# Patient Record
Sex: Male | Born: 1975 | Hispanic: Yes | Marital: Married | Smoking: Never smoker
Health system: Southern US, Community
[De-identification: ages and names within clinical notes are randomized; demographics above are authoritative.]

## PROBLEM LIST (undated history)

## (undated) DIAGNOSIS — E7800 Pure hypercholesterolemia, unspecified: Secondary | ICD-10-CM

## (undated) DIAGNOSIS — B019 Varicella without complication: Secondary | ICD-10-CM

## (undated) HISTORY — DX: Varicella without complication: B01.9

## (undated) HISTORY — PX: NO PAST SURGERIES: SHX2092

---

## 2008-04-07 ENCOUNTER — Emergency Department: Payer: Self-pay | Admitting: Emergency Medicine

## 2010-12-08 ENCOUNTER — Other Ambulatory Visit: Payer: Self-pay | Admitting: Orthopedic Surgery

## 2010-12-08 DIAGNOSIS — M5126 Other intervertebral disc displacement, lumbar region: Secondary | ICD-10-CM

## 2010-12-10 ENCOUNTER — Ambulatory Visit
Admission: RE | Admit: 2010-12-10 | Discharge: 2010-12-10 | Disposition: A | Discharge status: Home / Self Care | Payer: Worker's Compensation | Source: Ambulatory Visit | Attending: Orthopedic Surgery | Admitting: Orthopedic Surgery

## 2010-12-10 DIAGNOSIS — M5126 Other intervertebral disc displacement, lumbar region: Secondary | ICD-10-CM

## 2011-03-03 ENCOUNTER — Other Ambulatory Visit: Payer: Self-pay | Admitting: Neurosurgery

## 2011-03-03 DIAGNOSIS — M545 Low back pain: Secondary | ICD-10-CM

## 2011-03-04 ENCOUNTER — Ambulatory Visit
Admission: RE | Admit: 2011-03-04 | Discharge: 2011-03-04 | Disposition: A | Discharge status: Home / Self Care | Payer: Worker's Compensation | Source: Ambulatory Visit | Attending: Neurosurgery | Admitting: Neurosurgery

## 2011-03-04 DIAGNOSIS — M545 Low back pain: Secondary | ICD-10-CM

## 2011-03-19 ENCOUNTER — Other Ambulatory Visit: Payer: Self-pay | Admitting: Neurosurgery

## 2011-03-19 DIAGNOSIS — R51 Headache: Secondary | ICD-10-CM

## 2011-03-19 DIAGNOSIS — R110 Nausea: Secondary | ICD-10-CM

## 2011-03-22 ENCOUNTER — Inpatient Hospital Stay: Admission: RE | Admit: 2011-03-22 | Payer: Worker's Compensation | Source: Ambulatory Visit

## 2011-03-24 ENCOUNTER — Ambulatory Visit
Admission: RE | Admit: 2011-03-24 | Discharge: 2011-03-24 | Disposition: A | Discharge status: Home / Self Care | Payer: Worker's Compensation | Source: Ambulatory Visit | Attending: Neurosurgery | Admitting: Neurosurgery

## 2011-03-24 DIAGNOSIS — R51 Headache: Secondary | ICD-10-CM

## 2011-03-24 DIAGNOSIS — R110 Nausea: Secondary | ICD-10-CM

## 2012-09-25 IMAGING — CT CT L SPINE W/ CM
3 of 9 series · 10 of 27 positions shown, 11 images · non-contrast
Comparison: MRI is not available

CLINICAL DATA: Low back pain

CT MYELOGRAPHY LUMBAR SPINE
TECHNIQUE: CT imaging of the lumbar spine was performed after
intrathecal contrast administration.  Multiplanar CT image
reconstructions were also generated.

[Series 2: l spine bone · axial · 0.27mm/px · z∈[+33,+108]mm · 2 of 92 slices shown, 3 images]
[im 31/92  soft-tissue]
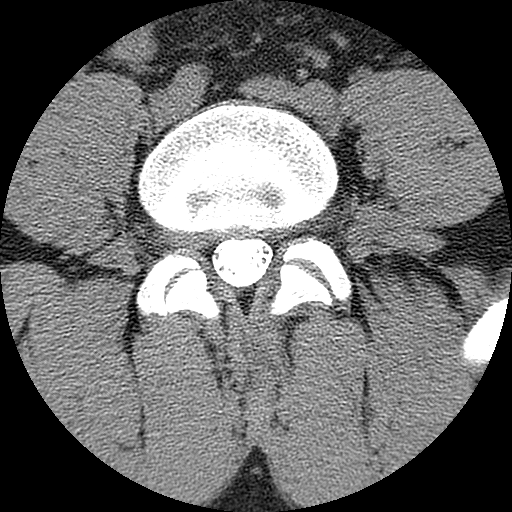
[im 31/92  bone]
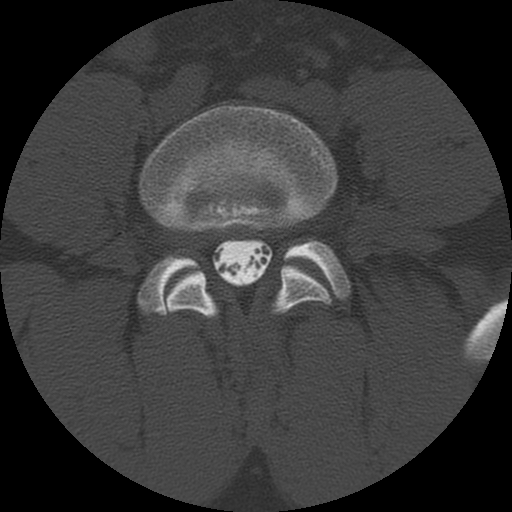
[im 61/92  bone]
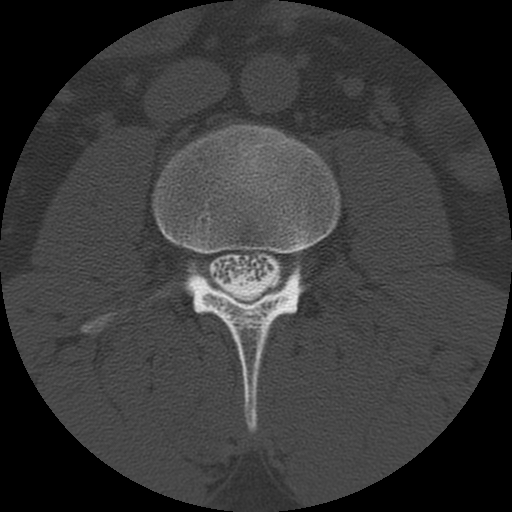

[Series 3: l spine soft · axial · 0.27mm/px · z∈[+13,+128]mm · 3 of 92 slices shown]
[im 23/92  soft-tissue]
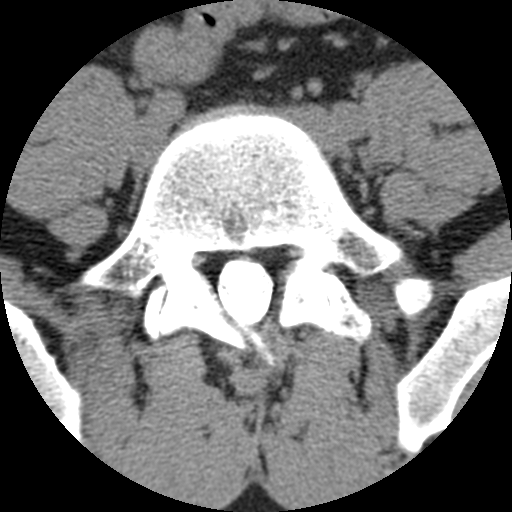
[im 46/92  soft-tissue]
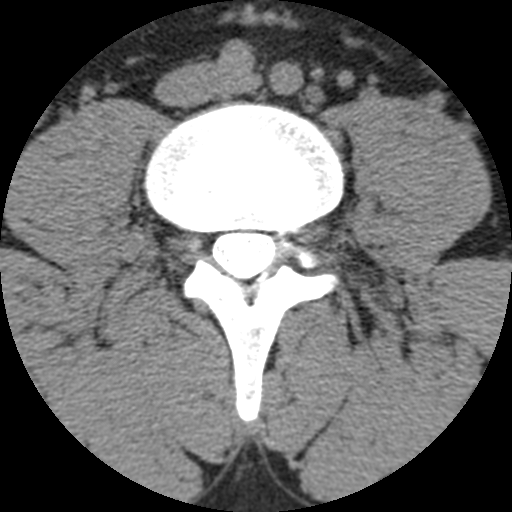
[im 69/92  soft-tissue]
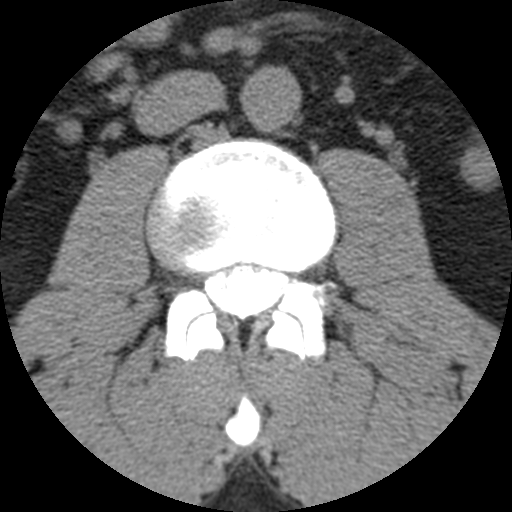

[Series 400: coronal · coronal · 0.46mm/px · 5 of 50 slices shown]
[im 9/50  bone]
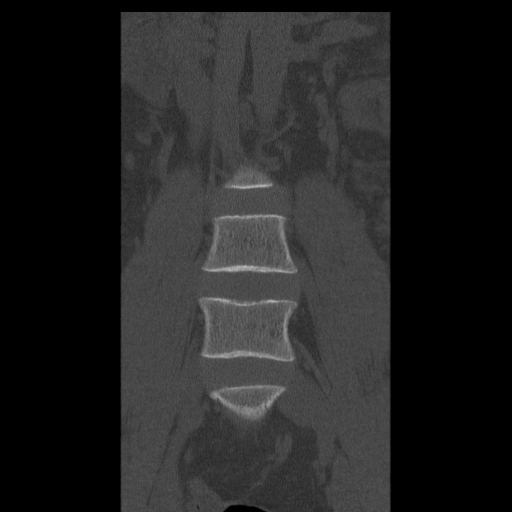
[im 17/50  bone]
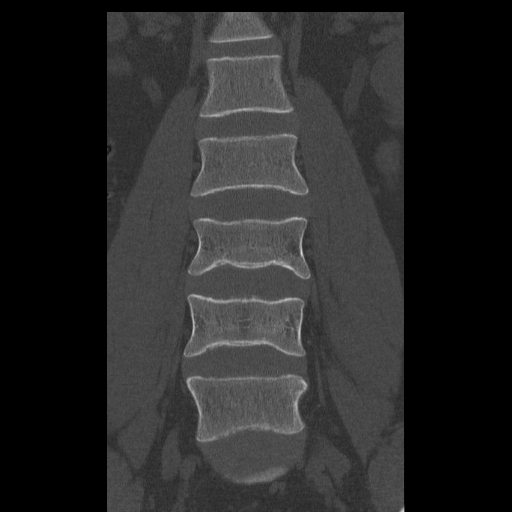
[im 25/50  bone]
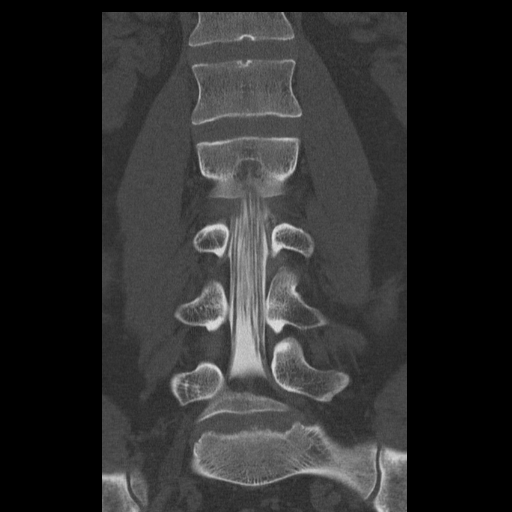
[im 33/50  bone]
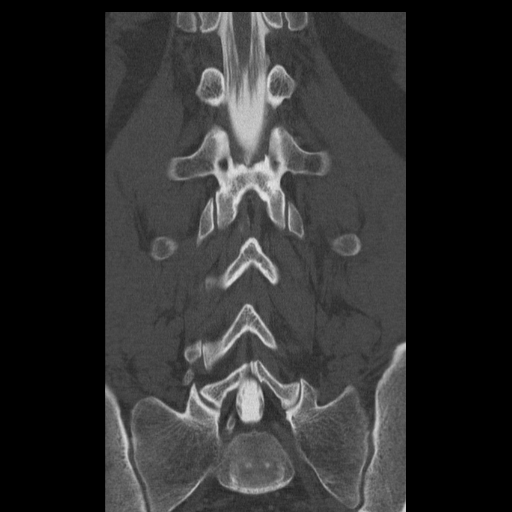
[im 41/50  bone]
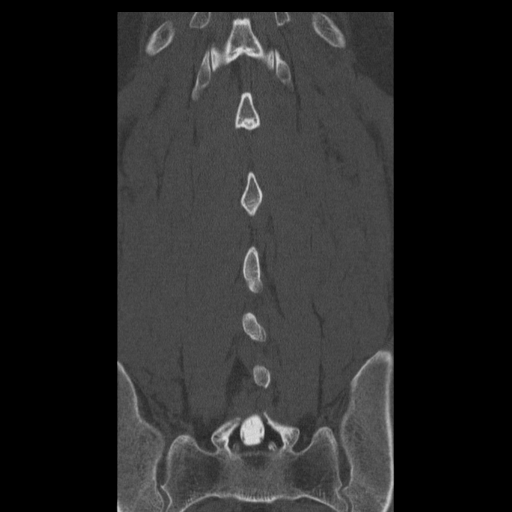

[10 of 27 positions shown; findings below may reference images not displayed]

FINDINGS: Anatomic alignment.  No vertebral compression deformity.
No pars defect. Failure fusion of the posterior elements at L5.
Conus medullaris terminates at the L1 inferior endplate.
Visualized aorta and kidneys are unremarkable.

L1-2:  Unremarkable.

L2-3:  Unremarkable.

L3-4:  Unremarkable.

L4-5:  Right foraminal protrusion with some right foraminal
narrowing and L4 nerve root encroachment.  No central stenosis.
Lateral recesses patent.

L5-S1:  Minimal left paracentral protrusion but no evidence of S1
nerve encroachment. There is some crowding of the left foramen
secondary to facet arthropathy and degenerative disc disease.
IMPRESSION: Right foraminal protrusion at L4-5. Minimal right L4 nerve root
encroachment.

Left paracentral protrusion at L5 S1. Left S1 nerve root is
unaffected by this. Mild left foraminal narrowing.

## 2016-05-13 ENCOUNTER — Other Ambulatory Visit: Payer: Self-pay | Admitting: Internal Medicine

## 2016-05-13 ENCOUNTER — Ambulatory Visit
Admission: RE | Admit: 2016-05-13 | Discharge: 2016-05-13 | Disposition: A | Discharge status: Home / Self Care | Payer: Self-pay | Source: Ambulatory Visit | Attending: Internal Medicine | Admitting: Internal Medicine

## 2016-05-13 DIAGNOSIS — I83891 Varicose veins of right lower extremities with other complications: Secondary | ICD-10-CM | POA: Insufficient documentation

## 2016-05-13 DIAGNOSIS — R51 Headache: Secondary | ICD-10-CM | POA: Insufficient documentation

## 2016-05-13 DIAGNOSIS — Z7689 Persons encountering health services in other specified circumstances: Secondary | ICD-10-CM | POA: Insufficient documentation

## 2016-09-23 DIAGNOSIS — M5416 Radiculopathy, lumbar region: Secondary | ICD-10-CM | POA: Insufficient documentation

## 2016-09-23 DIAGNOSIS — I83891 Varicose veins of right lower extremities with other complications: Secondary | ICD-10-CM | POA: Insufficient documentation

## 2016-09-24 ENCOUNTER — Other Ambulatory Visit: Payer: Self-pay | Admitting: Internal Medicine

## 2016-09-24 DIAGNOSIS — G44229 Chronic tension-type headache, not intractable: Secondary | ICD-10-CM

## 2016-10-06 ENCOUNTER — Ambulatory Visit
Admission: RE | Admit: 2016-10-06 | Discharge: 2016-10-06 | Disposition: A | Discharge status: Home / Self Care | Payer: 59 | Source: Ambulatory Visit | Attending: Internal Medicine | Admitting: Internal Medicine

## 2016-10-06 DIAGNOSIS — G44229 Chronic tension-type headache, not intractable: Secondary | ICD-10-CM | POA: Diagnosis present

## 2016-10-06 DIAGNOSIS — J320 Chronic maxillary sinusitis: Secondary | ICD-10-CM | POA: Insufficient documentation

## 2016-10-06 MED ORDER — GADOBENATE DIMEGLUMINE 529 MG/ML IV SOLN
20.0000 mL | Freq: Once | INTRAVENOUS | Status: AC | PRN
  Administered 2016-10-06: 16 mL via INTRAVENOUS

## 2019-01-24 DIAGNOSIS — R519 Headache, unspecified: Secondary | ICD-10-CM | POA: Insufficient documentation

## 2019-04-10 ENCOUNTER — Telehealth: Payer: Self-pay

## 2019-04-10 NOTE — Telephone Encounter (Signed)
Received called from patient stating his son received positive test results for COVID-19 on 04/09/19. Patient stated [place] [facility] advised him to self quarantine d/t being in close contact with son for 14 days. Patient stated he had COVID 33 tested performed today at [facility]. Patient advised his return to work date would be 04/24/19. Patient otherwise exhibiting no signs/symptoms of COVID-19.

## 2019-04-12 ENCOUNTER — Telehealth: Payer: Self-pay

## 2019-04-12 NOTE — Telephone Encounter (Signed)
Called patient to follow up. Patient states he continues to be asymptomatic at this time.

## 2019-04-19 ENCOUNTER — Other Ambulatory Visit (INDEPENDENT_AMBULATORY_CARE_PROVIDER_SITE_OTHER): Payer: Self-pay | Admitting: Vascular Surgery

## 2019-04-19 DIAGNOSIS — M7989 Other specified soft tissue disorders: Secondary | ICD-10-CM

## 2019-04-23 ENCOUNTER — Other Ambulatory Visit: Payer: Self-pay

## 2019-04-23 ENCOUNTER — Encounter (INDEPENDENT_AMBULATORY_CARE_PROVIDER_SITE_OTHER): Payer: Self-pay

## 2019-04-23 ENCOUNTER — Encounter (INDEPENDENT_AMBULATORY_CARE_PROVIDER_SITE_OTHER): Payer: Self-pay | Admitting: Vascular Surgery

## 2019-04-23 ENCOUNTER — Ambulatory Visit (INDEPENDENT_AMBULATORY_CARE_PROVIDER_SITE_OTHER): Payer: BC Managed Care – PPO | Admitting: Vascular Surgery

## 2019-04-23 DIAGNOSIS — I872 Venous insufficiency (chronic) (peripheral): Secondary | ICD-10-CM | POA: Diagnosis not present

## 2019-04-23 DIAGNOSIS — I8311 Varicose veins of right lower extremity with inflammation: Secondary | ICD-10-CM

## 2019-04-23 DIAGNOSIS — I8312 Varicose veins of left lower extremity with inflammation: Secondary | ICD-10-CM | POA: Diagnosis not present

## 2019-04-23 NOTE — Progress Notes (Signed)
MRN : [id number]  Willie Lam is a 43 y.o. (1976/07/24) male who presents with chief complaint of  Chief Complaint  Patient presents with  . New Patient (Initial Visit)    ref HandeLLE swelling  .  History of Present Illness:   The patient is seen for evaluation of symptomatic varicose veins of the right leg. The patient relates burning and stinging which worsened steadily throughout the course of the day, particularly with standing. The patient also notes an aching and throbbing pain over the varicosities, particularly with prolonged dependent positions. The symptoms are significantly improved with elevation.  The patient also notes that during hot weather the symptoms are greatly intensified. The patient states the pain from the varicose veins interferes with work, daily exercise, shopping and household maintenance. At this point, the symptoms are persistent and severe enough that they're having a negative impact on lifestyle and are interfering with daily activities.  There is no history of DVT, PE or superficial thrombophlebitis. There is no history of ulceration or hemorrhage. The patient denies a significant family history of varicose veins.  The patient has not worn graduated compression in the past. At the present time the patient has not been using over-the-counter analgesics. There is no history of prior surgical intervention or sclerotherapy.    Current Meds  Medication Sig  . atorvastatin (LIPITOR) 10 MG tablet Take 10 mg by mouth daily.  Marland Kitchen ibuprofen (ADVIL) 200 MG tablet Take by mouth.  . Multiple Vitamin (MULTI-VITAMIN) tablet Take by mouth.  . tamsulosin (FLOMAX) 0.4 MG CAPS capsule TAKE 1 CAPSULE (0.4 MG TOTAL) BY MOUTH ONCE DAILY TAKE 30 MINUTES AFTER SAME MEAL EACH DAY.  Marland Kitchen tiZANidine (ZANAFLEX) 4 MG tablet TAKE 1 TABLET (4 MG TOTAL) BY MOUTH 3 (THREE) TIMES DAILY AS NEEDED  . topiramate (TOPAMAX) 25 MG tablet Take by mouth.    Past Medical History:   Diagnosis Date  . Chicken pox     Past Surgical History:  Procedure Laterality Date  . NO PAST SURGERIES      Social History Social History   Tobacco Use  . Smoking status: Never Smoker  . Smokeless tobacco: Never Used  Substance Use Topics  . Alcohol use: Never    Frequency: Never  . Drug use: Never    Family History Family History  Problem Relation Age of Onset  . Varicose Veins Neg Hx   . Vision loss Neg Hx   . Stroke Neg Hx   No family history of bleeding/clotting disorders, porphyria or autoimmune disease   No Known Allergies   REVIEW OF SYSTEMS (Negative unless checked)  Constitutional: [] Weight loss  [] Fever  [] Chills Cardiac: [] Chest pain   [] Chest pressure   [] Palpitations   [] Shortness of breath when laying flat   [] Shortness of breath with exertion. Vascular:  [] Pain in legs with walking   [x] Pain in legs at rest  [] History of DVT   [] Phlebitis   [x] Swelling in legs   [x] Varicose veins   [] Non-healing ulcers Pulmonary:   [] Uses home oxygen   [] Productive cough   [] Hemoptysis   [] Wheeze  [] COPD   [] Asthma Neurologic:  [] Dizziness   [] Seizures   [] History of stroke   [] History of TIA  [] Aphasia   [] Vissual changes   [] Weakness or numbness in arm   [] Weakness or numbness in leg Musculoskeletal:   [] Joint swelling   [] Joint pain   [] Low back pain Hematologic:  [] Easy bruising  [] Easy bleeding   [] Hypercoagulable state   []   Anemic Gastrointestinal:  [] Diarrhea   [] Vomiting  [] Gastroesophageal reflux/heartburn   [] Difficulty swallowing. Genitourinary:  [] Chronic kidney disease   [] Difficult urination  [] Frequent urination   [] Blood in urine Skin:  [] Rashes   [] Ulcers  Psychological:  [] History of anxiety   []  History of major depression.  Physical Examination  Vitals:   04/23/19 1518  BP: 125/79  Pulse: 85  Resp: 16  Weight: 180 lb (81.6 kg)   There is no height or weight on file to calculate BMI. Gen: WD/WN, NAD Head: [place]/AT, No temporalis wasting.   Ear/Nose/Throat: Hearing grossly intact, nares w/o erythema or drainage, poor dentition Eyes: PER, EOMI, sclera nonicteric.  Neck: Supple, no masses.  No bruit or JVD.  Pulmonary:  Good air movement, clear to auscultation bilaterally, no use of accessory muscles.  Cardiac: RRR, normal S1, S2, no Murmurs. Vascular: Large varicosities present extensively greater than 10 mm right leg.  Moderate venous stasis changes to the legs bilaterally.  2+ soft pitting edema Vessel Right Left  Radial Palpable Palpable  PT Palpable Palpable  DP Palpable Palpable  Gastrointestinal: soft, non-distended. No guarding/no peritoneal signs.  Musculoskeletal: M/S 5/5 throughout.  No deformity or atrophy.  Neurologic: CN 2-12 intact. Pain and light touch intact in extremities.  Symmetrical.  Speech is fluent. Motor exam as listed above. Psychiatric: Judgment intact, Mood & affect appropriate for pt's clinical situation. Dermatologic: Venous rashes no ulcers noted.  No changes consistent with cellulitis. Lymph : No Cervical lymphadenopathy, no lichenification or skin changes of chronic lymphedema.  CBC No results found for: WBC, HGB, HCT, MCV, PLT  BMET No results found for: NA, K, CL, CO2, GLUCOSE, BUN, CREATININE, CALCIUM, GFRNONAA, GFRAA CrCl cannot be calculated (No successful lab value found.).  COAG No results found for: INR, PROTIME  Radiology No results found.   Assessment/Plan 1. Varicose veins of both lower extremities with inflammation  Recommend:  The patient has large symptomatic varicose veins that are painful and associated with swelling.  I have had a long discussion with the patient regarding  varicose veins and why they cause symptoms.  Patient will begin wearing graduated compression stockings class 1 on a daily basis, beginning first thing in the morning and removing them in the evening. The patient is instructed specifically not to sleep in the stockings.    The patient  will also  begin using over-the-counter analgesics such as Motrin 600 mg po TID to help control the symptoms.    In addition, behavioral modification including elevation during the day will be initiated.    Pending the results of these changes the  patient will be reevaluated in three months.   An  ultrasound of the venous system will be obtained.   Further plans will be based on the ultrasound results and whether conservative therapies are successful at eliminating the pain and swelling.   - VAS US LOWER EXTREMITY VENOUS REFLUX; Future  2. Chronic venous insufficiency No surgery or intervention at this point in time.    I have had a long discussion with the patient regarding venous insufficiency and why it  causes symptoms. I have discussed with the patient the chronic skin changes that accompany venous insufficiency and the long term sequela such as infection and ulceration.  Patient will begin wearing graduated compression stockings class 1 (20-30 mmHg) or compression wraps on a daily basis a prescription was given. The patient will put the stockings on first thing in the morning and removing them in the evening.  The patient is instructed specifically not to sleep in the stockings.    In addition, behavioral modification including several periods of elevation of the lower extremities during the day will be continued. I have demonstrated that proper elevation is a position with the ankles at heart level.  The patient is instructed to begin routine exercise, especially walking on a daily basis  Patient should undergo duplex ultrasound of the venous system to ensure that DVT or reflux is not present.  Following the review of the ultrasound the patient will follow up in 2-3 months to reassess the degree of swelling and the control that graduated compression stockings or compression wraps  is offering.   The patient can be assessed for a Lymph Pump at that time    Levora DredgeGregory Schnier, MD  04/23/2019 9:26  PM

## 2019-06-08 ENCOUNTER — Other Ambulatory Visit: Admission: RE | Admit: 2019-06-08 | Payer: BC Managed Care – PPO | Source: Ambulatory Visit

## 2019-06-13 ENCOUNTER — Encounter: Admission: RE | Payer: Self-pay | Source: Home / Self Care

## 2019-06-13 ENCOUNTER — Ambulatory Visit
Admission: RE | Admit: 2019-06-13 | Payer: BC Managed Care – PPO | Source: Home / Self Care | Admitting: Internal Medicine

## 2019-06-13 SURGERY — COLONOSCOPY WITH PROPOFOL
Anesthesia: General

## 2019-07-06 ENCOUNTER — Ambulatory Visit: Payer: Self-pay

## 2019-07-06 ENCOUNTER — Other Ambulatory Visit: Payer: Self-pay

## 2019-07-06 DIAGNOSIS — Z23 Encounter for immunization: Secondary | ICD-10-CM

## 2019-07-06 DIAGNOSIS — Z008 Encounter for other general examination: Secondary | ICD-10-CM

## 2019-07-06 LAB — POCT LIPID PANEL
HDL: 52
LDL: 63
Non-HDL: 115
POC Glucose: 139 mg/dl — AB (ref 70–99)
TC/HDL: 3.2
TC: 167
TRG: 259

## 2019-07-06 NOTE — Patient Instructions (Signed)
Prevencin de la gripe en los adultos Preventing Influenza, Adult La gripe es una infeccin viral que afecta, principalmente, las vas respiratorias. Las vas respiratorias incluyen las estructuras que lo ayudan a Industrial/product designer, [employer], la nariz y Administrator. La gripe provoca muchos sntomas del resfro comn, as como fiebre alta y Tourist information centre manager. Se transmite fcilmente de persona a persona (es contagiosa). La gripe es ms frecuente de diciembre a Community education officer. Este perodo se llama temporada de gripe. Puede contraer el virus de la gripe en las siguientes circunstancias:  Al aspirar las gotitas que una persona infectada elimina al toser o Engineering geologist.  Al tocar algo que fue recientemente contaminado con el virus y [facility] [facility] mano a la boca, la nariz o los ojos. Qu puedo hacer para disminuir el riesgo?        Es posible Printmaker de contraer gripe tomando las siguientes medidas:  Vacunarse contra la gripe(vacunacin antigripal) todos los [place]. Esta es la mejor forma de prevenir la gripe. Se recomienda que todos las personas de o ms se vacunen contra la gripe. ? Es mejor vacunarse en el otoo, tan pronto como la vacuna est disponible. De todos modos, vacunarse durante el invierno o la primavera tambin es bueno. La temporada de gripe puede durar hasta principios de la primavera. ? Para prevenir la gripe mediante vacunacin es necesario vacunarse todos los [place]. La razn es que el virus de la gripe cambia levemente(muta) de ao a ao. Aunque la vacuna antigripal no lo proteja completamente contra todas las mutaciones del virus, puede disminuir la gravedad de la enfermedad y prevenir complicaciones peligrosas de la gripe. ? Si est embarazada, usted puede y debe aplicarse la vacuna contra la gripe. ? Si ha tenido Designer, industrial/product reaccin a Financial risk analyst pasado o si es alrgico a los huevos, consulte al mdico antes de aplicarse la vacuna contra la gripe. ? Algunas veces, se  puede conseguir la vacuna en la forma de aerosol nasal. Algunos aos el aerosol nasal no fue tan efectivo contra el virus de la gripe. Consulte con su mdico si tiene preguntas sobre esto.  Tener hbitos saludables. Esto es muy importante durante la temporada de gripe. ? Evite el contacto con personas que tengan sntomas de resfro o gripe. ? Lvese las manos frecuentemente con agua y [place]. Use desinfectante para manos con alcohol si no dispone de [place] y [place]. ? Evite tocarse la cara con las manos; sobre todo, cuando no se las haya lavado recientemente. ? Use un desinfectante para limpiar las superficies en el hogar y en el trabajo que pueden estar contaminadas con el virus de la gripe. ? Mantenga el sistema del cuerpo que lucha contra las enfermedades (sistema inmunitario) en buen estado siguiendo una dieta saludable, bebiendo mucho lquido, durmiendo lo suficiente y realizando actividad fsica con regularidad. Si contrae gripe, evite contagiar a otros haciendo lo siguiente:  No salga de su casa hasta que los sntomas hayan desaparecido, al menos, durante un da.  Al toser o estornudar, cbrase la boca y la [place].  Evite el contacto cercano con otras personas; especialmente, con bebs y Financial planner. Por qu son importantes estos cambios? La vacunacin contra la gripe y la prctica de hbitos saludables lo protegen a usted y a Dentist. Si se enferma de gripe, sus amigos, familiares y colegas tambin tienen riesgo de enfermarse porque se contagia muy fcilmente de unos a otros. Cada ao, alrededor American Financial de cada diez personas contraen gripe.  Es posible que la gripe cause complicaciones, como neumona, otitis y sinusitis. La gripe tambin puede ser mortal; especialmente, en el caso de los bebs, los [place] de 65aos y las personas con enfermedades crnicas graves. Cmo se trata? La Harley[employer]Davidson de las personas se recuperan de la gripe haciendo reposo y bebiendo mucho lquido. Sin embargo, la  prescripcin de un medicamento antiviral puede disminuir los sntomas de la gripe y Radio producer que esta desaparezca antes. Se debe comenzar a tomar PPL Corporation a los [address] del inicio de los sntomas. Hable con su mdico acerca de si necesita tomar un medicamento antiviral. Pueden prescribirse medicamentos antivirales para las personas que tienen un riesgo alto de tener sntomas graves de gripe. Entre ellos se incluyen personas que:  Son [place] de 65 aos de edad.  Estn embarazadas.  Tienen alguna enfermedad que favorece a que la gripe empeore o sea ms peligrosa. Dnde buscar ms informacin  Centros para el control y la prevencin de Child psychotherapist for Disease Control and Prevention, CDC): tsavxtf.com  ItsBlog.fr: InternetEnthusiasts.hu  Market researcher de Mdicos de Cabin crew (American Academy of Family Physicians): Hydrologist.org/familydoctor/en/kids/vaccines/preventing-the-flu.html Comunquese con un mdico si:  Tiene gripe y le aparecen nuevos sntomas.  Tiene los siguientes sntomas: ? Journalist, newspaper. ? Diarrea. ? Fiebre.  La tos empeora o tiene ms mucosidad. Resumen  La mejor manera de prevenir la gripe es vacunarse todos los aos en otoo.  Puede contraer la gripe despus de haberse colocado la vacuna anual, pero posiblemente esta sea ms leve o se cure ms rpidamente gracias a la vacuna antigripal.  Si contrae gripe, es posible aliviar los sntomas y curarse ms rpidamente si comienza a Teacher, adult education antivirales a los [employer] Financial del inicio de los sntomas.  Tambin puede prevenir la gripe teniendo hbitos saludables. Esta informacin no tiene Theme park manager el consejo del mdico. Asegrese de hacerle al mdico cualquier pregunta que tenga. Document Released: 10/12/2015 Document Revised: 01/07/2018 Document Reviewed: 10/12/2015 Elsevier Patient Education  2020 Elsevier Inc. [place] antigripal (inactivada o  recombinante): lo que debe saber Influenza (Flu) Vaccine (Inactivated or Recombinant): What You Need to Know 1. Por qu vacunarse? La vacuna contra la gripe puede prevenir la gripe. La gripe es una enfermedad contagiosa que se disemina en los [place] cada ao, por lo general, Eusebio Me octubre y [place]. Cualquier persona puede contraer gripe, pero es ms peligrosa para Runner, broadcasting/film/video. Los bebs y los nios pequeos, los [employer] de 65aos, las [place], as [employer] personas que tienen ciertas enfermedades o cuyo sistema inmunitario est debilitado corren un riesgo mayor de tener complicaciones debido a la gripe. La neumona, la bronquitis, las infecciones de los senos paranasales y las infecciones de odos son ejemplos de complicaciones relacionadas con la gripe. Si tiene una afeccin, por ejemplo, enfermedad cardaca, cncer o diabetes, la gripe puede empeorarla. La gripe puede causar fiebre y escalofros, dolor de garganta, dolores musculares, fatiga, tos, dolor de [place] y secrecin o congestin nasal. Algunas personas pueden tener vmitos y Barnett Hatter, [place] esto es ms frecuente en los nios que en los adultos. Cada ao, miles de [employer] [place] debido a la gripe, y muchas ms deben ser hospitalizadas. Cada ao, la vacuna antigripal previene millones de enfermedades y evita visitas al mdico relacionadas con la gripe. 2. Madilyn Fireman contra la gripe Los CDC (Centros para Air traffic controller y la Prevencin de Event organiser) recomiendan que todas las personas a Glass blower/designer de los 6 meses de edad se vacunen cada temporada  de gripe. Es posible que los nios de 6meses a 8aos deban recibir 2 dosis durante la misma temporada de gripe. Todas las dems personas tienen que aplicarse 1 sola dosis cada temporada de gripe. La vacuna comienza a surtir Librarian, academicefecto aproximadamente 2semanas despus de su aplicacin. Hay muchos virus de la gripe, y Estate agentestos mutan permanentemente. Cada ao, se elabora una nueva  vacuna antigripal para brindar proteccin contra tres o cuatro virus que probablemente causen la enfermedad en la siguiente temporada de gripe. Incluso si la vacuna no es especfica para esos virus, aun as puede brindar cierta proteccin. La vacuna contra la gripe no causa gripe. La vacuna contra la gripe puede administrarse al mismo tiempo que otras vacunas. 3. Hable con el mdico Comunquese con la persona que le coloca las vacunas si la persona que la recibe:  Ha tenido una reaccin alrgica despus de Neomia Dearuna dosis previa de la vacuna contra la gripe o tiene alguna alergia grave, potencialmente mortal.  Alguna vez tuvo sndrome de Guillain-Barr (tambin llamado SGB). En algunos casos, es posible que el mdico decida posponer la aplicacin de la vacuna contra la gripe para una visita en el futuro. Las personas que sufren trastornos menores, como un resfro, pueden vacunarse. Las Eli Lilly and Companypersonas que tienen enfermedades moderadas o graves generalmente deben esperar hasta recuperarse para poder recibir la vacuna contra la gripe. Su mdico puede darle ms informacin. 4. Riesgos de [place] reaccin a la vacuna  Despus de recibir la vacuna contra la gripe, [place] haber dolor, enrojecimiento e Paramedichinchazn en el lugar de la inyeccin, fiebre, dolores musculares y Engineer, miningdolor de Turkmenistancabeza.  Puede haber un pequeo aumento del riesgo de sufrir sndrome de Pension scheme managerGuillain-Barr (SGB) despus de la aplicacin de la vacuna contra la gripe inactivada. Los nios pequeos que reciben la vacuna antigripal junto con la vacuna antineumoccica (PCV13), o la DTaP en el mismo momento pueden tener una probabilidad un poco ms elevada de tener una convulsin debido a la fiebre. Informe al mdico si un nio que est recibiendo la vacuna antigripal ha tenido una convulsin alguna vez. Las personas a veces se desmayan despus de procedimientos mdicos, incluida la vacunacin. Informe al mdico si se siente mareado, tiene cambios en la visin o zumbidos  en los odos. Al igual que con cualquier Automatic Datamedicamento, existe una probabilidad muy remota de que una vacuna cause una reaccin alrgica grave, otra lesin grave o la muerte. 5. Qu pasa si se presenta un problema grave? Podra producirse una reaccin alrgica despus de que la persona vacunada abandone la clnica. Si observa signos de Runner, broadcasting/film/videouna reaccin alrgica grave (ronchas, hinchazn de la cara y la garganta, dificultad para respirar, latidos cardacos acelerados, mareos o debilidad), llame al 9-1-1 y lleve a la persona al hospital ms cercano. Si se presentan otros signos que le preocupan, comunquese con su mdico. Las reacciones adversas deben informarse al Sistema de Informe de Eventos Adversos de Administrator, artsVacunas (Vaccine Adverse Event Reporting System, VAERS). Por lo general, el mdico presenta este informe o puede hacerlo usted mismo. Visite el sitio web del VAERS en www.vaers.LAgents.nohhs.gov o llame al [phone].El VAERS es solo para Biomedical engineerinformar reacciones; su personal no proporciona asesoramiento mdico. 6. Programa Nacional de Compensacin de Daos por American Electric PowerVacunas El SunTrustPrograma Nacional de Compensacin de Daos por Administrator, artsVacunas (National Vaccine Injury Kohl'sCompensation Program, Cabin crewVICP) es un programa federal que fue creado para Patent examinercompensar a las personas que puedan haber sufrido daos al recibir ciertas vacunas. Visite el sitio web del VICP en SpiritualWord.atwww.hrsa.gov/vaccinecompensation o llame al [phone] para obtener ms  informacin acerca del programa y de cmo presentar un reclamo. Hay un lmite de tiempo para presentar un reclamo de compensacin. 7. Cmo puedo obtener ms informacin?  Pregntele al mdico.  Comunquese con el servicio de salud de su localidad o su estado.  Comunquese con los Centros para el Control y la Prevencin de Probation officer for Disease Control and Prevention, CDC): ? Llame al ([phone] (1-800-CDC-INFO) o ? Visite el sitio Biomedical engineer en https://gibson.com/ Declaracin de informacin  (provisional) sobre la vacuna contra la gripe inactivada (15/05/2018) Esta informacin no tiene Marine scientist el consejo del mdico. Asegrese de hacerle al mdico cualquier pregunta que tenga. Document Released: 12/24/2008 Document Revised: 06/06/2018 Document Reviewed: 06/06/2018 Elsevier Patient Education  2020 [employer].

## 2019-07-06 NOTE — Progress Notes (Signed)
     Patient ID: Willie Lam, male    DOB: June 02, 1976, 43 y.o.   MRN: [id number]    Thank you!!  Apolonio Schneiders RN  [place] Nurse Specialist [place]: [phone]  Cell:  [phone] Website: [email or web address]

## 2019-07-26 ENCOUNTER — Other Ambulatory Visit: Payer: Self-pay

## 2019-07-26 ENCOUNTER — Ambulatory Visit (INDEPENDENT_AMBULATORY_CARE_PROVIDER_SITE_OTHER): Payer: BC Managed Care – PPO | Admitting: Vascular Surgery

## 2019-07-26 ENCOUNTER — Ambulatory Visit (INDEPENDENT_AMBULATORY_CARE_PROVIDER_SITE_OTHER): Payer: BC Managed Care – PPO

## 2019-07-26 ENCOUNTER — Encounter (INDEPENDENT_AMBULATORY_CARE_PROVIDER_SITE_OTHER): Payer: Self-pay | Admitting: Vascular Surgery

## 2019-07-26 VITALS — BP 129/79 | HR 62 | Resp 16 | Wt 180.0 lb

## 2019-07-26 DIAGNOSIS — I872 Venous insufficiency (chronic) (peripheral): Secondary | ICD-10-CM

## 2019-07-26 DIAGNOSIS — I8312 Varicose veins of left lower extremity with inflammation: Secondary | ICD-10-CM | POA: Diagnosis not present

## 2019-07-26 DIAGNOSIS — I8311 Varicose veins of right lower extremity with inflammation: Secondary | ICD-10-CM | POA: Diagnosis not present

## 2019-07-26 NOTE — Progress Notes (Signed)
MRN : [id number]  Willie Lam is a 43 y.o. (Nov 30, 1975) male who presents with chief complaint of  Chief Complaint  Patient presents with  . Follow-up    ultrasound follow up  .  History of Present Illness:   The patient returns for followup evaluation 3 months after the initial visit. The patient continues to have pain in the lower extremities with dependency. The pain is lessened with elevation. Graduated compression stockings, Class I (20-30 mmHg), have been worn but the stockings do not eliminate the leg pain. Over-the-counter analgesics do not improve the symptoms. The degree of discomfort continues to interfere with daily activities. The patient notes the pain in the legs is causing problems with daily exercise, at the workplace and even with household activities and maintenance such as standing in the kitchen preparing meals and doing dishes.   Venous ultrasound shows normal deep venous system, no evidence of acute or chronic DVT.  Superficial reflux is present in the right GSV  No outpatient medications have been marked as taking for the 07/26/19 encounter (Office Visit) with Delana Meyer, Dolores Lory, MD.    Past Medical History:  Diagnosis Date  . Chicken pox     Past Surgical History:  Procedure Laterality Date  . NO PAST SURGERIES      Social History Social History   Tobacco Use  . Smoking status: Never Smoker  . Smokeless tobacco: Never Used  Substance Use Topics  . Alcohol use: Never    Frequency: Never  . Drug use: Never    Family History Family History  Problem Relation Age of Onset  . Varicose Veins Neg Hx   . Vision loss Neg Hx   . Stroke Neg Hx     No Known Allergies   REVIEW OF SYSTEMS (Negative unless checked)  Constitutional: [] Weight loss  [] Fever  [] Chills Cardiac: [] Chest pain   [] Chest pressure   [] Palpitations   [] Shortness of breath when laying flat   [] Shortness of breath with exertion. Vascular:  [] Pain in legs with walking    [x] Pain in legs at rest  [] History of DVT   [] Phlebitis   [x] Swelling in legs   [x] Varicose veins   [] Non-healing ulcers Pulmonary:   [] Uses home oxygen   [] Productive cough   [] Hemoptysis   [] Wheeze  [] COPD   [] Asthma Neurologic:  [] Dizziness   [] Seizures   [] History of stroke   [] History of TIA  [] Aphasia   [] Vissual changes   [] Weakness or numbness in arm   [] Weakness or numbness in leg Musculoskeletal:   [] Joint swelling   [] Joint pain   [] Low back pain Hematologic:  [] Easy bruising  [] Easy bleeding   [] Hypercoagulable state   [] Anemic Gastrointestinal:  [] Diarrhea   [] Vomiting  [] Gastroesophageal reflux/heartburn   [] Difficulty swallowing. Genitourinary:  [] Chronic kidney disease   [] Difficult urination  [] Frequent urination   [] Blood in urine Skin:  [] Rashes   [] Ulcers  Psychological:  [] History of anxiety   []  History of major depression.  Physical Examination  Vitals:   07/26/19 1524  BP: 129/79  Pulse: 62  Resp: 16  Weight: 180 lb (81.6 kg)   There is no height or weight on file to calculate BMI. Gen: WD/WN, NAD Head: [place]/AT, No temporalis wasting.  Ear/Nose/Throat: Hearing grossly intact, nares w/o erythema or drainage Eyes: PER, EOMI, sclera nonicteric.  Neck: Supple, no large masses.   Pulmonary:  Good air movement, no audible wheezing bilaterally, no use of accessory muscles.  Cardiac: RRR, no  JVD Vascular: Large varicosities present extensively greater than 10 mm right.  Mild venous stasis changes to the legs bilaterally.  2+ soft pitting edema Vessel Right Left  PT Palpable Palpable  DP Palpable Palpable  Gastrointestinal: Non-distended. No guarding/no peritoneal signs.  Musculoskeletal: M/S 5/5 throughout.  No deformity or atrophy.  Neurologic: CN 2-12 intact. Symmetrical.  Speech is fluent. Motor exam as listed above. Psychiatric: Judgment intact, Mood & affect appropriate for pt's clinical situation. Dermatologic: No rashes or ulcers noted.  No changes consistent  with cellulitis. Lymph : No lichenification or skin changes of chronic lymphedema.  CBC No results found for: WBC, HGB, HCT, MCV, PLT  BMET No results found for: NA, K, CL, CO2, GLUCOSE, BUN, CREATININE, CALCIUM, GFRNONAA, GFRAA CrCl cannot be calculated (No successful lab value found.).  COAG No results found for: INR, PROTIME  Radiology No results found.   Assessment/Plan 1. Varicose veins of both lower extremities with inflammation Recommend  I have reviewed my previous  discussion with the patient regarding  varicose veins and why they cause symptoms. Patient will continue  wearing graduated compression stockings class 1 on a daily basis, beginning first thing in the morning and removing them in the evening.    In addition, behavioral modification including elevation during the day was again discussed and this will continue.  The patient has utilized over the counter pain medications and has been exercising.  However, at this time conservative therapy has not alleviated the patient's symptoms of leg pain and swelling  Recommend: laser ablation of the right great saphenous vein to eliminate the symptoms of pain and swelling of the lower extremities caused by the severe superficial venous reflux disease.   2. Chronic venous insufficiency No surgery or intervention at this point in time.    I have had a long discussion with the patient regarding venous insufficiency and why it  causes symptoms. I have discussed with the patient the chronic skin changes that accompany venous insufficiency and the long term sequela such as infection and ulceration.  Patient will begin wearing graduated compression stockings class 1 (20-30 mmHg) or compression wraps on a daily basis a prescription was given. The patient will put the stockings on first thing in the morning and removing them in the evening. The patient is instructed specifically not to sleep in the stockings.    In addition,  behavioral modification including several periods of elevation of the lower extremities during the day will be continued. I have demonstrated that proper elevation is a position with the ankles at heart level.  The patient is instructed to begin routine exercise, especially walking on a daily basis     Levora Dredge, MD  07/26/2019 3:36 PM

## 2019-08-16 ENCOUNTER — Other Ambulatory Visit: Payer: Self-pay

## 2019-08-16 ENCOUNTER — Other Ambulatory Visit
Admission: RE | Admit: 2019-08-16 | Discharge: 2019-08-16 | Disposition: A | Discharge status: Home / Self Care | Payer: BC Managed Care – PPO | Source: Ambulatory Visit | Attending: Internal Medicine | Admitting: Internal Medicine

## 2019-08-16 DIAGNOSIS — Z20828 Contact with and (suspected) exposure to other viral communicable diseases: Secondary | ICD-10-CM | POA: Insufficient documentation

## 2019-08-16 DIAGNOSIS — Z01812 Encounter for preprocedural laboratory examination: Secondary | ICD-10-CM | POA: Diagnosis present

## 2019-08-16 LAB — SARS CORONAVIRUS 2 (TAT 6-24 HRS): SARS Coronavirus 2: NEGATIVE

## 2019-08-20 ENCOUNTER — Encounter: Payer: Self-pay | Admitting: *Deleted

## 2019-08-20 ENCOUNTER — Encounter
Admission: RE | Disposition: A | Discharge status: Home / Self Care | Payer: Self-pay | Source: Home / Self Care | Attending: Internal Medicine

## 2019-08-20 ENCOUNTER — Other Ambulatory Visit: Payer: Self-pay

## 2019-08-20 ENCOUNTER — Encounter: Payer: Self-pay | Admitting: Anesthesiology

## 2019-08-20 ENCOUNTER — Ambulatory Visit
Admission: RE | Admit: 2019-08-20 | Discharge: 2019-08-20 | Disposition: A | Discharge status: Home / Self Care | Payer: BC Managed Care – PPO | Attending: Internal Medicine | Admitting: Internal Medicine

## 2019-08-20 SURGERY — COLONOSCOPY WITH PROPOFOL
Anesthesia: General

## 2019-09-27 ENCOUNTER — Encounter (INDEPENDENT_AMBULATORY_CARE_PROVIDER_SITE_OTHER): Payer: Self-pay | Admitting: Vascular Surgery

## 2019-09-27 ENCOUNTER — Ambulatory Visit (INDEPENDENT_AMBULATORY_CARE_PROVIDER_SITE_OTHER): Payer: BC Managed Care – PPO | Admitting: Vascular Surgery

## 2019-09-27 ENCOUNTER — Other Ambulatory Visit: Payer: Self-pay

## 2019-09-27 VITALS — BP 110/78 | HR 76 | Resp 16 | Ht 63.0 in | Wt 181.0 lb

## 2019-09-27 DIAGNOSIS — I8311 Varicose veins of right lower extremity with inflammation: Secondary | ICD-10-CM

## 2019-09-27 DIAGNOSIS — I8312 Varicose veins of left lower extremity with inflammation: Secondary | ICD-10-CM | POA: Diagnosis not present

## 2019-09-27 NOTE — Progress Notes (Signed)
    MRN : [id number]  Willie Lam is a 43 y.o. (Jun 01, 1976) male who presents with chief complaint of painful varicose veins.    The patient's right lower extremity was sterilely prepped and draped.  The ultrasound machine was used to visualize the great saphenous vein throughout its course.  A segment at the knee was selected for access.  The saphenous vein was accessed without difficulty using ultrasound guidance with a micropuncture needle.   An 0.018  wire was placed beyond the saphenofemoral junction through the sheath and the microneedle was removed.  The 65 cm sheath was then placed over the wire and the wire and dilator were removed.  The laser fiber was placed through the sheath and its tip was placed approximately 2 cm below the saphenofemoral junction.  Tumescent anesthesia was then created with a dilute lidocaine solution.  Laser energy was then delivered with constant withdrawal of the sheath and laser fiber.  Approximately 1214 Joules of energy were delivered over a length of 29 cm.  Sterile dressings were placed.  The patient tolerated the procedure well without complications.

## 2019-10-01 ENCOUNTER — Other Ambulatory Visit: Payer: Self-pay

## 2019-10-01 ENCOUNTER — Other Ambulatory Visit (INDEPENDENT_AMBULATORY_CARE_PROVIDER_SITE_OTHER): Payer: Self-pay | Admitting: Vascular Surgery

## 2019-10-01 ENCOUNTER — Ambulatory Visit (INDEPENDENT_AMBULATORY_CARE_PROVIDER_SITE_OTHER): Payer: BC Managed Care – PPO

## 2019-10-01 DIAGNOSIS — I8311 Varicose veins of right lower extremity with inflammation: Secondary | ICD-10-CM

## 2019-10-01 DIAGNOSIS — I8312 Varicose veins of left lower extremity with inflammation: Secondary | ICD-10-CM | POA: Diagnosis not present

## 2019-10-25 ENCOUNTER — Ambulatory Visit (INDEPENDENT_AMBULATORY_CARE_PROVIDER_SITE_OTHER): Payer: BC Managed Care – PPO | Admitting: Nurse Practitioner

## 2019-10-25 ENCOUNTER — Encounter (INDEPENDENT_AMBULATORY_CARE_PROVIDER_SITE_OTHER): Payer: Self-pay | Admitting: Nurse Practitioner

## 2019-10-25 ENCOUNTER — Other Ambulatory Visit: Payer: Self-pay

## 2019-10-25 VITALS — BP 111/72 | HR 75 | Resp 16 | Wt 182.4 lb

## 2019-10-25 DIAGNOSIS — M5416 Radiculopathy, lumbar region: Secondary | ICD-10-CM | POA: Diagnosis not present

## 2019-10-25 DIAGNOSIS — I8312 Varicose veins of left lower extremity with inflammation: Secondary | ICD-10-CM | POA: Diagnosis not present

## 2019-10-25 DIAGNOSIS — I8311 Varicose veins of right lower extremity with inflammation: Secondary | ICD-10-CM | POA: Diagnosis not present

## 2019-10-25 NOTE — Progress Notes (Signed)
SUBJECTIVE:  Patient ID: Willie Lam, male    DOB: 1976/05/04, 44 y.o.   MRN: [id number] Chief Complaint  Patient presents with  . Follow-up    4week poat laser    HPI  Willie Lam is a 44 y.o. male The patient returns to the office for followup status post laser ablation of the right great saphenous vein on 09/17/2019. The patient notes multiple residual varicosities bilaterally which continued to hurt with dependent positions and remained tender to palpation. The patient's swelling is unchanged from preoperative status. The patient continues to wear graduated compression stockings on a daily basis but these are not eliminating the pain and discomfort. The patient continues to use over-the-counter anti-inflammatory medications to treat the pain and related symptoms but this has not given the patient relief. The patient notes the pain in the lower extremities is causing problems with daily exercise, problems at work and even with household activities such as preparing meals and doing dishes.  The patient is otherwise done well and there have been no complications related to the laser procedure or interval changes in the patient's overall   Venous ultrasound post laser shows successful laser ablation of the right great saphenous vein, no DVT identified.  Past Medical History:  Diagnosis Date  . Chicken pox     Past Surgical History:  Procedure Laterality Date  . NO PAST SURGERIES      Social History   Socioeconomic History  . Marital status: Married    Spouse name: Not on file  . Number of children: Not on file  . Years of education: Not on file  . Highest education level: Not on file  Occupational History  . Not on file  Tobacco Use  . Smoking status: Never Smoker  . Smokeless tobacco: Never Used  Substance and Sexual Activity  . Alcohol use: Never  . Drug use: Never  . Sexual activity: Not on file  Other Topics Concern  . Not on file  Social History  Narrative  . Not on file   Social Determinants of Health   Financial Resource Strain:   . Difficulty of Paying Living Expenses: Not on file  Food Insecurity:   . Worried About Programme researcher, broadcasting/film/video in the Last Year: Not on file  . Ran Out of Food in the Last Year: Not on file  Transportation Needs:   . Lack of Transportation (Medical): Not on file  . Lack of Transportation (Non-Medical): Not on file  Physical Activity:   . Days of Exercise per Week: Not on file  . Minutes of Exercise per Session: Not on file  Stress:   . Feeling of Stress : Not on file  Social Connections:   . Frequency of Communication with Friends and Family: Not on file  . Frequency of Social Gatherings with Friends and Family: Not on file  . Attends Religious Services: Not on file  . Active Member of Clubs or Organizations: Not on file  . Attends Banker Meetings: Not on file  . Marital Status: Not on file  Intimate Partner Violence:   . Fear of Current or Ex-Partner: Not on file  . Emotionally Abused: Not on file  . Physically Abused: Not on file  . Sexually Abused: Not on file    Family History  Problem Relation Age of Onset  . Varicose Veins Neg Hx   . Vision loss Neg Hx   . Stroke Neg Hx     No  Known Allergies   Review of Systems   Review of Systems: Negative Unless Checked Constitutional: [] Weight loss  [] Fever  [] Chills Cardiac: [] Chest pain   []  Atrial Fibrillation  [] Palpitations   [] Shortness of breath when laying flat   [] Shortness of breath with exertion. [] Shortness of breath at rest Vascular:  [] Pain in legs with walking   [] Pain in legs with standing [] Pain in legs when laying flat   [] Claudication    [] Pain in feet when laying flat    [] History of DVT   [] Phlebitis   [] Swelling in legs   [x] Varicose veins   [] Non-healing ulcers Pulmonary:   [] Uses home oxygen   [] Productive cough   [] Hemoptysis   [] Wheeze  [] COPD   [] Asthma Neurologic:  [] Dizziness   [] Seizures   [] Blackouts [] History of stroke   [] History of TIA  [] Aphasia   [] Temporary Blindness   [] Weakness or numbness in arm   [] Weakness or numbness in leg Musculoskeletal:   [] Joint swelling   [] Joint pain   [] Low back pain  []  History of Knee Replacement [x] Arthritis [] back Surgeries  []  Spinal Stenosis    Hematologic:  [] Easy bruising  [] Easy bleeding   [] Hypercoagulable state   [] Anemic Gastrointestinal:  [] Diarrhea   [] Vomiting  [] Gastroesophageal reflux/heartburn   [] Difficulty swallowing. [] Abdominal pain Genitourinary:  [] Chronic kidney disease   [] Difficult urination  [] Anuric   [] Blood in urine [] Frequent urination  [] Burning with urination   [] Hematuria Skin:  [] Rashes   [] Ulcers [] Wounds Psychological:  [] History of anxiety   []  History of major depression  []  Memory Difficulties      OBJECTIVE:   Physical Exam  BP 111/72 (BP Location: Right Arm)   Pulse 75   Resp 16   Wt 182 lb 6.4 oz (82.7 kg)   BMI 32.31 kg/m   Gen: WD/WN, NAD Head: [place]/AT, No temporalis wasting.  Ear/Nose/Throat: Hearing grossly intact, nares w/o erythema or drainage Eyes: PER, EOMI, sclera nonicteric.  Neck: Supple, no masses.  No JVD.  Pulmonary:  Good air movement, no use of accessory muscles.  Cardiac: RRR Vascular: Large palpable rope like varicosities remain in right lower extremity, measuring 4-4mm Vessel Right Left  Radial Palpable Palpable  Dorsalis Pedis Palpable Palpable  Posterior Tibial Palpable Palpable   Gastrointestinal: soft, non-distended. No guarding/no peritoneal signs.  Musculoskeletal: M/S 5/5 throughout.  No deformity or atrophy.  Neurologic: Pain and light touch intact in extremities.  Symmetrical.  Speech is fluent. Motor exam as listed above. Psychiatric: Judgment intact, Mood & affect appropriate for pt's clinical situation. Dermatologic: bilateral stasis dermatitis. No Ulcers Noted.  No changes consistent with cellulitis. Lymph : No Cervical lymphadenopathy, no  lichenification or skin changes of chronic lymphedema.       ASSESSMENT AND PLAN:  1. Varicose veins of both lower extremities with inflammation Recommend:  The patient has had successful ablation of the previously incompetent saphenous venous system but still has persistent symptoms of pain and swelling that are having a negative impact on daily life and daily activities.  Patient should undergo foam injection sclerotherapy to treat the residual varicosities of the right lower extremity.  The risks, benefits and alternative therapies were reviewed in detail with the patient.  All questions were answered.  The patient agrees to proceed with sclerotherapy at their convenience.  The patient will continue wearing the graduated compression stockings and using the over-the-counter pain medications to treat her symptoms.       2. Radiculopathy of lumbar region Continue NSAID medications  as already ordered, these medications have been reviewed and there are no changes at this time.  Continued activity and therapy was stressed.    Current Outpatient Medications on File Prior to Visit  Medication Sig Dispense Refill  . atorvastatin (LIPITOR) 10 MG tablet Take 10 mg by mouth daily.    Marland Kitchen ibuprofen (ADVIL) 200 MG tablet Take by mouth.    Marland Kitchen atorvastatin (LIPITOR) 40 MG tablet Take 40 mg by mouth daily.    . hydrocortisone (ANUSOL-HC) 2.5 % rectal cream APPLY TO AFFECTED AREA 3 TIMES A DAY    . Multiple Vitamin (MULTI-VITAMIN) tablet Take by mouth.    . pantoprazole (PROTONIX) 40 MG tablet Take by mouth.    . tamsulosin (FLOMAX) 0.4 MG CAPS capsule TAKE 1 CAPSULE (0.4 MG TOTAL) BY MOUTH ONCE DAILY TAKE 30 MINUTES AFTER SAME MEAL EACH DAY.    Marland Kitchen tiZANidine (ZANAFLEX) 4 MG tablet TAKE 1 TABLET (4 MG TOTAL) BY MOUTH 3 (THREE) TIMES DAILY AS NEEDED    . topiramate (TOPAMAX) 25 MG tablet Take by mouth.     No current facility-administered medications on file prior to visit.    There are no  Patient Instructions on file for this visit. No follow-ups on file.   Kris Hartmann, NP  This note was completed with Sales executive.  Any errors are purely unintentional.

## 2019-11-22 ENCOUNTER — Ambulatory Visit (INDEPENDENT_AMBULATORY_CARE_PROVIDER_SITE_OTHER): Payer: BC Managed Care – PPO | Admitting: Vascular Surgery

## 2019-11-22 ENCOUNTER — Other Ambulatory Visit: Payer: Self-pay

## 2019-11-22 ENCOUNTER — Encounter (INDEPENDENT_AMBULATORY_CARE_PROVIDER_SITE_OTHER): Payer: Self-pay | Admitting: Vascular Surgery

## 2019-11-22 VITALS — BP 116/77 | HR 74 | Resp 12 | Ht 63.0 in | Wt 185.0 lb

## 2019-11-22 DIAGNOSIS — I8311 Varicose veins of right lower extremity with inflammation: Secondary | ICD-10-CM

## 2019-11-22 DIAGNOSIS — I8312 Varicose veins of left lower extremity with inflammation: Secondary | ICD-10-CM

## 2019-11-22 NOTE — Progress Notes (Signed)
    MRN : [id number]  Willie Lam is a 44 y.o. ([birth year]) male who presents with chief complaint of  Chief Complaint  Patient presents with  . Follow-up    R foam sclero  .   Procedure:  Sclerotherapy using a total of 7 cc of foam with 1% Lidocaine was performed on lower extremities bilateral.  Compression wraps were placed.  The patient tolerated the procedure well.  Right leg was wrapped with Coban  Plan:  Follow up as arranged

## 2019-12-13 ENCOUNTER — Encounter (INDEPENDENT_AMBULATORY_CARE_PROVIDER_SITE_OTHER): Payer: Self-pay | Admitting: Vascular Surgery

## 2019-12-13 ENCOUNTER — Ambulatory Visit (INDEPENDENT_AMBULATORY_CARE_PROVIDER_SITE_OTHER): Payer: BC Managed Care – PPO | Admitting: Vascular Surgery

## 2019-12-13 ENCOUNTER — Other Ambulatory Visit: Payer: Self-pay

## 2019-12-13 VITALS — BP 118/80 | HR 77 | Resp 10 | Ht 63.0 in | Wt 183.0 lb

## 2019-12-13 DIAGNOSIS — I83891 Varicose veins of right lower extremities with other complications: Secondary | ICD-10-CM

## 2019-12-14 ENCOUNTER — Encounter (INDEPENDENT_AMBULATORY_CARE_PROVIDER_SITE_OTHER): Payer: Self-pay | Admitting: Vascular Surgery

## 2019-12-14 NOTE — Progress Notes (Signed)
Willie Lam is a 44 y.o.male who presents with painful varicose veins of the right leg  Past Medical History:  Diagnosis Date  . Chicken pox     Past Surgical History:  Procedure Laterality Date  . NO PAST SURGERIES      Current Outpatient Medications  Medication Sig Dispense Refill  . atorvastatin (LIPITOR) 10 MG tablet Take 10 mg by mouth daily.    Marland Kitchen atorvastatin (LIPITOR) 40 MG tablet Take 40 mg by mouth daily.    . hydrocortisone (ANUSOL-HC) 2.5 % rectal cream APPLY TO AFFECTED AREA 3 TIMES A DAY    . ibuprofen (ADVIL) 200 MG tablet Take by mouth.    . Multiple Vitamin (MULTI-VITAMIN) tablet Take by mouth.    . pantoprazole (PROTONIX) 40 MG tablet Take by mouth.    . tamsulosin (FLOMAX) 0.4 MG CAPS capsule TAKE 1 CAPSULE (0.4 MG TOTAL) BY MOUTH ONCE DAILY TAKE 30 MINUTES AFTER SAME MEAL EACH DAY.    Marland Kitchen tiZANidine (ZANAFLEX) 4 MG tablet TAKE 1 TABLET (4 MG TOTAL) BY MOUTH 3 (THREE) TIMES DAILY AS NEEDED    . topiramate (TOPAMAX) 25 MG tablet Take by mouth.     No current facility-administered medications for this visit.    No Known Allergies  Indication: Patient presents with symptomatic varicose veins of the right lower extremity.  Procedure: Foam sclerotherapy was performed on the right lower extremity. Using ultrasound guidance, 5 mL of foam Sotradecol was used to inject the varicosities of the right lower extremity. Compression wraps were placed. The patient tolerated the procedure well.

## 2020-01-03 ENCOUNTER — Ambulatory Visit (INDEPENDENT_AMBULATORY_CARE_PROVIDER_SITE_OTHER): Payer: BC Managed Care – PPO | Admitting: Vascular Surgery

## 2023-11-22 ENCOUNTER — Ambulatory Visit: Payer: BC Managed Care – PPO | Admitting: Anesthesiology

## 2023-11-22 ENCOUNTER — Encounter
Admission: RE | Disposition: A | Discharge status: Home / Self Care | Payer: Self-pay | Source: Home / Self Care | Attending: Internal Medicine

## 2023-11-22 ENCOUNTER — Other Ambulatory Visit: Payer: Self-pay

## 2023-11-22 ENCOUNTER — Ambulatory Visit
Admission: RE | Admit: 2023-11-22 | Discharge: 2023-11-22 | Disposition: A | Discharge status: Home / Self Care | Payer: BC Managed Care – PPO | Attending: Internal Medicine | Admitting: Internal Medicine

## 2023-11-22 ENCOUNTER — Encounter: Payer: Self-pay | Admitting: Internal Medicine

## 2023-11-22 DIAGNOSIS — Z1211 Encounter for screening for malignant neoplasm of colon: Secondary | ICD-10-CM | POA: Insufficient documentation

## 2023-11-22 DIAGNOSIS — E7800 Pure hypercholesterolemia, unspecified: Secondary | ICD-10-CM | POA: Insufficient documentation

## 2023-11-22 DIAGNOSIS — R1310 Dysphagia, unspecified: Secondary | ICD-10-CM | POA: Diagnosis not present

## 2023-11-22 DIAGNOSIS — Z79899 Other long term (current) drug therapy: Secondary | ICD-10-CM | POA: Diagnosis not present

## 2023-11-22 DIAGNOSIS — K640 First degree hemorrhoids: Secondary | ICD-10-CM | POA: Insufficient documentation

## 2023-11-22 HISTORY — PX: MALONEY DILATION: SHX5535

## 2023-11-22 HISTORY — DX: Pure hypercholesterolemia, unspecified: E78.00

## 2023-11-22 HISTORY — PX: ESOPHAGOGASTRODUODENOSCOPY (EGD) WITH PROPOFOL: SHX5813

## 2023-11-22 HISTORY — PX: COLONOSCOPY WITH PROPOFOL: SHX5780

## 2023-11-22 SURGERY — COLONOSCOPY WITH PROPOFOL
Anesthesia: General

## 2023-11-22 MED ORDER — PROPOFOL 10 MG/ML IV BOLUS
INTRAVENOUS | Status: DC | PRN
  Administered 2023-11-22: 100 mg via INTRAVENOUS

## 2023-11-22 MED ORDER — SODIUM CHLORIDE 0.9 % IV SOLN: INTRAVENOUS | Status: DC

## 2023-11-22 MED ORDER — LIDOCAINE HCL (CARDIAC) PF 100 MG/5ML IV SOSY
PREFILLED_SYRINGE | INTRAVENOUS | Status: DC | PRN
  Administered 2023-11-22: 100 mg via INTRAVENOUS

## 2023-11-22 MED ORDER — LIDOCAINE HCL (PF) 2 % IJ SOLN
INTRAMUSCULAR | Status: AC
  Filled 2023-11-22: qty 5

## 2023-11-22 MED ORDER — PROPOFOL 1000 MG/100ML IV EMUL
INTRAVENOUS | Status: AC
  Filled 2023-11-22: qty 100

## 2023-11-22 MED ORDER — PROPOFOL 500 MG/50ML IV EMUL
INTRAVENOUS | Status: DC | PRN
  Administered 2023-11-22: 150 ug/kg/min via INTRAVENOUS

## 2023-11-22 NOTE — Transfer of Care (Signed)
Immediate Anesthesia Transfer of Care Note  Patient: Willie Lam  Procedure(s) Performed: COLONOSCOPY WITH PROPOFOL ESOPHAGOGASTRODUODENOSCOPY (EGD) WITH PROPOFOL MALONEY DILATION  Patient Location: PACU  Anesthesia Type:General  Level of Consciousness: awake and sedated  Airway & Oxygen Therapy: Patient Spontanous Breathing and Patient connected to face mask oxygen  Post-op Assessment: Report given to RN and Post -op Vital signs reviewed and stable  Post vital signs: Reviewed and stable  Last Vitals:  Vitals Value Taken Time  BP    Temp    Pulse    Resp    SpO2      Last Pain:  Vitals:   11/22/23 1201  TempSrc: Temporal  PainSc: 0-No pain         Complications: There were no known notable events for this encounter.

## 2023-11-22 NOTE — Op Note (Signed)
[facility] Gastroenterology Patient Name: Willie Lam Procedure Date: 11/22/2023 1:02 PM MRN: [id number] Account #: [id number] Date of Birth: 1976-02-01 Admit Type: Outpatient Age: 48 Room: [facility] ENDO ROOM 1 Gender: Male Note Status: Finalized Instrument Name: Upper Endoscope [id number] Procedure:             Upper GI endoscopy Indications:           Dysphagia Providers:             Royce Macadamia K. Norma Fredrickson MD, MD Referring MD:          Barbette Reichmann, MD (Referring MD) Medicines:             Propofol per Anesthesia Complications:         No immediate complications. Estimated blood loss: None. Procedure:             Pre-Anesthesia Assessment:                        - The risks and benefits of the procedure and the                         sedation options and risks were discussed with the                         patient. All questions were answered and informed                         consent was obtained.                        - Patient identification and proposed procedure were                         verified prior to the procedure by the nurse. The                         procedure was verified in the procedure room.                        - ASA Grade Assessment: III - A patient with severe                         systemic disease.                        - After reviewing the risks and benefits, the patient                         was deemed in satisfactory condition to undergo the                         procedure.                        After obtaining informed consent, the endoscope was                         passed under direct vision. Throughout the procedure,  the patient's blood pressure, pulse, and oxygen                         saturations were monitored continuously. The Endoscope                         was introduced through the mouth, and advanced to the                         third part of duodenum. The upper GI endoscopy was                          accomplished without difficulty. The patient tolerated                         the procedure well. Findings:      The examined esophagus was normal. The scope was withdrawn. Dilation was       performed with a Maloney dilator with no resistance at 54 Fr.      The stomach was normal.      The examined duodenum was normal. Impression:            - Normal esophagus. Dilated.                        - Normal stomach.                        - Normal examined duodenum.                        - No specimens collected. Recommendation:        - Monitor results to esophageal dilation                        - Proceed with colonoscopy Procedure Code(s):     --- Professional ---                        [phone], Esophagogastroduodenoscopy, flexible,                         transoral; diagnostic, including collection of                         specimen(s) by brushing or washing, when performed                         (separate procedure)                        43450, Dilation of esophagus, by unguided sound or                         bougie, single or multiple passes Diagnosis Code(s):     --- Professional ---                        R13.10, Dysphagia, unspecified CPT copyright 2022 American Medical Association. All rights reserved. The codes documented in this report are preliminary and upon coder review may  be revised to meet current compliance requirements. Boykin Nearing [place]  MD, MD 11/22/2023 1:29:53 PM This report has been signed electronically. Number of Addenda: 0 Note Initiated On: 11/22/2023 1:02 PM Estimated Blood Loss:  Estimated blood loss: none.      [facility]

## 2023-11-22 NOTE — H&P (Signed)
Outpatient short stay form Pre-procedure 11/22/2023 1:10 PM Willie Lam K. Willie Lam, M.D.  Primary Physician: Barbette Reichmann, M.D.  Reason for visit:  Dysphagia, colon cancer screening  History of present illness:  He reports he has noticed at least a 3-year history of non-progressive esophageal dysphagia symptoms to solid foods like meats, breads, and rice. Dysphagia is localized to suprasternal notch. There is no regurgitation of food bolus. He usually drinks water or some other liquid when he gets sensation like something is hung and then it will eventually pass. He denies any dysphagia to liquids or pills. He denies any known family history of GI malignancies. He denies any symptoms of heartburn or acid reflux. He does not currently take any daily acid suppression therapy. He denies any nausea, vomiting, odynophagia, early satiety, hoarseness, or epigastric abdominal pain. No significant changes in his bowel habits. He denies any hematochezia or melena. He takes Advil 200 mg once a week for aches and pains. He has never had a colonoscopy. He was scheduled to have one back in 2020 for complaints of itching and rectal bleeding, but never followed through with procedure.      Current Facility-Administered Medications:    0.9 %  sodium chloride infusion, , Intravenous, Continuous, [place], Boykin Nearing, MD, Last Rate: 20 mL/hr at 11/22/23 1224, New Bag at 11/22/23 1224  Medications Prior to Admission  Medication Sig Dispense Refill Last Dose/Taking   atorvastatin (LIPITOR) 10 MG tablet Take 10 mg by mouth daily.   Past Week   atorvastatin (LIPITOR) 40 MG tablet Take 40 mg by mouth daily.   Past Week   Multiple Vitamin (MULTI-VITAMIN) tablet Take by mouth.   Past Week   pantoprazole (PROTONIX) 40 MG tablet Take by mouth.   Past Week   tamsulosin (FLOMAX) 0.4 MG CAPS capsule TAKE 1 CAPSULE (0.4 MG TOTAL) BY MOUTH ONCE DAILY TAKE 30 MINUTES AFTER SAME MEAL EACH DAY.   Past Month   hydrocortisone  (ANUSOL-HC) 2.5 % rectal cream APPLY TO AFFECTED AREA 3 TIMES A DAY      ibuprofen (ADVIL) 200 MG tablet Take by mouth.      tiZANidine (ZANAFLEX) 4 MG tablet TAKE 1 TABLET (4 MG TOTAL) BY MOUTH 3 (THREE) TIMES DAILY AS NEEDED      topiramate (TOPAMAX) 25 MG tablet Take by mouth.        No Known Allergies   Past Medical History:  Diagnosis Date   Chicken pox    Hypercholesteremia     Review of systems:  Otherwise negative.    Physical Exam  Gen: Alert, oriented. Appears stated age.  HEENT: [place]/AT. PERRLA. Lungs: CTA, no wheezes. CV: RR nl S1, S2. Abd: soft, benign, no masses. BS+ Ext: No edema. Pulses 2+    Planned procedures: Proceed with EGD and colonoscopy. The patient understands the nature of the planned procedure, indications, risks, alternatives and potential complications including but not limited to bleeding, infection, perforation, damage to internal organs and possible oversedation/side effects from anesthesia. The patient agrees and gives consent to proceed.  Please refer to procedure notes for findings, recommendations and patient disposition/instructions.     Keyanna Sandefer K. Willie Lam, M.D. Gastroenterology 11/22/2023  1:10 PM

## 2023-11-22 NOTE — Op Note (Signed)
 [facility] Patient Name: Willie Lam Procedure Date: 11/22/2023 1:02 PM MRN: [id number] Account #: [id number] Date of Birth: [birth year] Admit Type: Outpatient Age: 48 Room: [facility] ENDO ROOM 1 Gender: Male Note Status: Finalized Instrument Name: Prentice Docker [id number] Procedure:             Colonoscopy Indications:           Screening for colorectal malignant neoplasm Providers:             Royce Macadamia K. Norma Fredrickson MD, MD Referring MD:          Barbette Reichmann, MD (Referring MD) Medicines:             Propofol per Anesthesia Complications:         No immediate complications. Procedure:             Pre-Anesthesia Assessment:                        - The risks and benefits of the procedure and the                         sedation options and risks were discussed with the                         patient. All questions were answered and informed                         consent was obtained.                        - Patient identification and proposed procedure were                         verified prior to the procedure by the nurse. The                         procedure was verified in the procedure room.                        - ASA Grade Assessment: II - A patient with mild                         systemic disease.                        - After reviewing the risks and benefits, the patient                         was deemed in satisfactory condition to undergo the                         procedure.                        After obtaining informed consent, the colonoscope was                         passed under direct vision. Throughout the procedure,                         the  patient's blood pressure, pulse, and oxygen                         saturations were monitored continuously. The                         Colonoscope was introduced through the anus and                         advanced to the the cecum, identified by appendiceal                          orifice and ileocecal valve. The colonoscopy was                         performed without difficulty. The patient tolerated                         the procedure well. The quality of the bowel                         preparation was good. The ileocecal valve, appendiceal                         orifice, and rectum were photographed. Findings:      The perianal and digital rectal examinations were normal. Pertinent       negatives include normal sphincter tone and no palpable rectal lesions.      The colon (entire examined portion) appeared normal.      Non-bleeding internal hemorrhoids were found during retroflexion. The       hemorrhoids were Grade I (internal hemorrhoids that do not prolapse). Impression:            - The entire examined colon is normal.                        - Non-bleeding internal hemorrhoids.                        - No specimens collected. Recommendation:        - Monitor results to esophageal dilation                        - Patient has a contact number available for                         emergencies. The signs and symptoms of potential                         delayed complications were discussed with the patient.                         Return to normal activities tomorrow. Written                         discharge instructions were provided to the patient.                        - Resume previous diet.                        -  Continue present medications.                        - Repeat colonoscopy in 10 years for screening                         purposes.                        - Follow up with Jacob Moores, PA-C in the GI office.                         [phone]                        - Telephone GI office to schedule appointment in 3                         months.                        - The findings and recommendations were discussed with                         the patient. Procedure Code(s):     --- Professional ---                         W1027, Colorectal cancer screening; colonoscopy on                         individual not meeting criteria for high risk Diagnosis Code(s):     --- Professional ---                        K64.0, First degree hemorrhoids                        Z12.11, Encounter for screening for malignant neoplasm                         of colon CPT copyright 2022 [employer]. All rights reserved. The codes documented in this report are preliminary and upon coder review may  be revised to meet current compliance requirements. Stanton Kidney MD, MD 11/22/2023 1:44:57 PM This report has been signed electronically. Number of Addenda: 0 Note Initiated On: 11/22/2023 1:02 PM Scope Withdrawal Time: 0 hours 5 minutes 1 second  Total Procedure Duration: 0 hours 8 minutes 23 seconds  Estimated Blood Loss:  Estimated blood loss: none.      [facility]

## 2023-11-22 NOTE — Interval H&P Note (Signed)
History and Physical Interval Note:  11/22/2023 1:12 PM  Willie Lam  has presented today for surgery, with the diagnosis of R13.19 (ICD-10-CM) - Esophageal dysphagia Z12.11 (ICD-10-CM) - Colon cancer screening.  The various methods of treatment have been discussed with the patient and family. After consideration of risks, benefits and other options for treatment, the patient has consented to  Procedure(s) with comments: COLONOSCOPY WITH PROPOFOL (N/A) - SPANISH INTERPRETER ESOPHAGOGASTRODUODENOSCOPY (EGD) WITH PROPOFOL (N/A) as a surgical intervention.  The patient's history has been reviewed, patient examined, no change in status, stable for surgery.  I have reviewed the patient's chart and labs.  Questions were answered to the patient's satisfaction.     [place], [place]

## 2023-11-22 NOTE — Anesthesia Preprocedure Evaluation (Signed)
Anesthesia Evaluation  Patient identified by MRN, date of birth, ID band Patient awake    Reviewed: Allergy & Precautions, NPO status , Patient's Chart, lab work & pertinent test results  History of Anesthesia Complications Negative for: history of anesthetic complications  Airway Mallampati: III  TM Distance: <3 FB Neck ROM: full    Dental  (+) Chipped, Poor Dentition   Pulmonary neg pulmonary ROS, neg shortness of breath   Pulmonary exam normal        Cardiovascular Exercise Tolerance: Good negative cardio ROS Normal cardiovascular exam     Neuro/Psych  Headaches  Neuromuscular disease  negative psych ROS   GI/Hepatic negative GI ROS, Neg liver ROS,,,  Endo/Other  negative endocrine ROS    Renal/GU negative Renal ROS  negative genitourinary   Musculoskeletal   Abdominal   Peds  Hematology negative hematology ROS (+)   Anesthesia Other Findings Past Medical History: No date: Chicken pox No date: Hypercholesteremia  Past Surgical History: No date: NO PAST SURGERIES  BMI    Body Mass Index: 30.55 kg/m      Reproductive/Obstetrics negative OB ROS                             Anesthesia Physical Anesthesia Plan  ASA: 2  Anesthesia Plan: General   Post-op Pain Management:    Induction: Intravenous  PONV Risk Score and Plan: Propofol infusion and TIVA  Airway Management Planned: Natural Airway and Nasal Cannula  Additional Equipment:   Intra-op Plan:   Post-operative Plan:   Informed Consent: I have reviewed the patients History and Physical, chart, labs and discussed the procedure including the risks, benefits and alternatives for the proposed anesthesia with the patient or authorized representative who has indicated his/her understanding and acceptance.     Dental Advisory Given and Interpreter used for interview  Plan Discussed with: Anesthesiologist, CRNA and  Surgeon  Anesthesia Plan Comments: (Patient consented for risks of anesthesia including but not limited to:  - adverse reactions to medications - risk of airway placement if required - damage to eyes, teeth, lips or other oral mucosa - nerve damage due to positioning  - sore throat or hoarseness - Damage to heart, brain, nerves, lungs, other parts of body or loss of life  Patient voiced understanding and assent.)       Anesthesia Quick Evaluation

## 2023-11-23 ENCOUNTER — Encounter: Payer: Self-pay | Admitting: Internal Medicine

## 2023-11-23 NOTE — Anesthesia Postprocedure Evaluation (Signed)
Anesthesia Post Note  Patient: Willie Lam  Procedure(s) Performed: COLONOSCOPY WITH PROPOFOL ESOPHAGOGASTRODUODENOSCOPY (EGD) WITH PROPOFOL MALONEY DILATION  Patient location during evaluation: Endoscopy Anesthesia Type: General Level of consciousness: awake and alert Pain management: pain level controlled Vital Signs Assessment: post-procedure vital signs reviewed and stable Respiratory status: spontaneous breathing, nonlabored ventilation, respiratory function stable and patient connected to nasal cannula oxygen Cardiovascular status: blood pressure returned to baseline and stable Postop Assessment: no apparent nausea or vomiting Anesthetic complications: no   There were no known notable events for this encounter.   Last Vitals:  Vitals:   11/22/23 1352 11/22/23 1402  BP: 118/78 117/85  Pulse: 92 60  Resp: 16 16  Temp:    SpO2: 99% 98%    Last Pain:  Vitals:   11/22/23 1352  TempSrc:   PainSc: 1                  Cleda Mccreedy Greenley Martone
